# Patient Record
Sex: Male | Born: 1990 | Race: White | Hispanic: No | Marital: Single | Smoking: Current some day smoker
Health system: Southern US, Community
[De-identification: ages and names within clinical notes are randomized; demographics above are authoritative.]

---

## 2016-01-12 ENCOUNTER — Ambulatory Visit (INDEPENDENT_AMBULATORY_CARE_PROVIDER_SITE_OTHER): Payer: BLUE CROSS/BLUE SHIELD | Admitting: Osteopathic Medicine

## 2016-01-12 ENCOUNTER — Encounter: Payer: Self-pay | Admitting: Osteopathic Medicine

## 2016-01-12 VITALS — BP 112/74 | HR 64 | Ht 68.0 in | Wt 177.0 lb

## 2016-01-12 DIAGNOSIS — M25512 Pain in left shoulder: Secondary | ICD-10-CM | POA: Diagnosis not present

## 2016-01-12 DIAGNOSIS — M25521 Pain in right elbow: Secondary | ICD-10-CM | POA: Diagnosis not present

## 2016-01-12 DIAGNOSIS — J4530 Mild persistent asthma, uncomplicated: Secondary | ICD-10-CM

## 2016-01-12 DIAGNOSIS — T7589XA Other specified effects of external causes, initial encounter: Secondary | ICD-10-CM | POA: Diagnosis not present

## 2016-01-12 DIAGNOSIS — IMO0001 Reserved for inherently not codable concepts without codable children: Secondary | ICD-10-CM

## 2016-01-12 MED ORDER — ALBUTEROL SULFATE 108 (90 BASE) MCG/ACT IN AEPB: 1.0000 | INHALATION_SPRAY | Freq: Four times a day (QID) | RESPIRATORY_TRACT | Status: AC | PRN

## 2016-01-12 MED ORDER — MOMETASONE FUROATE 110 MCG/INH IN AEPB: 1.0000 | INHALATION_SPRAY | Freq: Every day | RESPIRATORY_TRACT | Status: AC

## 2016-01-12 NOTE — Progress Notes (Signed)
HPI: Patrick Villanueva is a 25 y.o. male who presents to [facility] today for chief complaint of:  Chief Complaint  Patient presents with  . Establish Care    CONGESTION AND POSSIBLE TENDONITIS    ILLNESS/CHEST CONGESTION . 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 No hx asthma. No pets. No known mold exp . Duration: started 6 months ago  . Timing: worse at work, better at home and at nighttime, some problems with air  . Modifying factors: OTC meds tried but not helpful  . Assoc signs/symptoms:   ELBOW PAIN . Location: R arm at medial elbow  . Quality: sharp with rotation motion . Timing: comes and goes  . Context: works as Personnel officer  . Modifying factors: Right handed  . Assoc signs/symptoms: no hand/finger numbness, no wrist pain   SHOULDER  . Location: L sholder in the front [facility] joint area . Quality: pinching pain . Modifying factors: worse with stretching up    Past medical, social and family history reviewed: History reviewed. No pertinent past medical history. History reviewed. No pertinent past surgical history. Social History  Substance Use Topics  . Smoking status: Current Some Day Smoker -- 1 years    Types: Cigars  . Smokeless tobacco: Not on file  . Alcohol Use: Not on file   Family History  Problem Relation Age of Onset  . Depression Mother   . Hypertension Mother   . Hypertension Maternal Grandmother   . Hyperlipidemia Maternal Grandmother   . Diabetes Paternal Grandmother   . Heart attack Maternal Grandfather     No current outpatient prescriptions on file.   No current facility-administered medications for this visit.   Allergies  Allergen Reactions  . Diphenhydramine Rash      Review of Systems: CONSTITUTIONAL:  No  fever, no chills, No recent illness, No unintentional weight changes HEAD/EYES/EARS/NOSE/THROAT: No  headache, no vision change, no hearing change, No sore throat, No  sinus  pressure CARDIAC: No  chest pain, No  pressure, No palpitations, No  orthopnea RESPIRATORY: (+) cough, No  shortness of breath/wheeze GASTROINTESTINAL: No  nausea, No  vomiting, No  abdominal pain, No  blood in stool, No  diarrhea, No  constipation  MUSCULOSKELETAL: (+) myalgia/arthralgia GENITOURINARY: No  incontinence, No  abnormal genital bleeding/discharge SKIN: No  rash/wounds/concerning lesions HEM/ONC: No  easy bruising/bleeding, No  abnormal lymph node ENDOCRINE: No polyuria/polydipsia/polyphagia, No  heat/cold intolerance  NEUROLOGIC: No  weakness, No  dizziness, No  slurred speech PSYCHIATRIC: No  concerns with depression, No  concerns with anxiety, No sleep problems  Exam:  BP 112/74 mmHg  Pulse 64  Ht  (1.727 m)  Wt 177 lb (80.287 kg)  BMI 26.92 kg/m2 Constitutional: VS see above. General Appearance: alert, well-developed, well-nourished, NAD Eyes: Normal lids and conjunctive, non-icteric sclera, PERRLA Ears, Nose, Mouth, Throat: MMM, Normal external inspection ears/nares/mouth/lips/gums, TM normal bilaterally. Pharynx/tonsils no erythema, no exudate. Nasal mucosa normal.   Neck: No masses, trachea midline. No thyroid enlargement. No tenderness/mass appreciated. No lymphadenopathy Respiratory: Normal respiratory effort. no wheeze, no rhonchi, no rales Cardiovascular: S1/S2 normal, no murmur, no rub/gallop auscultated. RRR. No lower extremity edema. Gastrointestinal: Nontender, no masses.  Musculoskeletal: Gait normal. No clubbing/cyanosis of digits.  left Shoulder Exam Range of motion: normal Strength tests:   Empty Can (Supraspinatus): negative  Drop Arm (Supraspinatus):negative  Liftoff (Subscapularis): negative  Yergason (Biceps Tendonitis/Labrum): positive  Instability tests  Apprehension (Anterior Instability): negative Impingement tests  Crossover (AC Joint): positive  Hawkins (Impingement/Subacromial Bursitis):   ExtRot (Subscap against  Coracoaromion): negative  IntRot (Supspin, TeresMin, Infraspin): negative  Neer's (Posterior impingement, Biceps tendonitis): negative Right elbow exam: (+) tenderness at medial epicondyle with supination Skin: warm, dry, intact. No rash/ulcer. No concerning nevi or subq nodules on limited exam.   Psychiatric: Normal judgment/insight. Normal mood and affect. Oriented x3.     ASSESSMENT/PLAN: See instructions - treat as asthma variant  / RAD d/t airway irritant exposure. Consider job change, review chemicals he might be exposed to [employer](occ health?). Consider PFT or pulm referral for methacholine challenge if no better.   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 Visit summary with medication list and pertinent instructions was printed for patient to review. ER/RTC precautions were reviewed with the patient. Return if symptoms worsen or fail to improve.

## 2016-01-12 NOTE — Patient Instructions (Signed)
First try Proair Respiclick device (albuterol) as needed - whether prior to going into spaces which normally irritate the lungs, and as needed for chest congestion. If you find you are using this every day or multiple times per day, fill the prescription for the Arnuity Ellipta device (inhaled steroid) and use this daily prior to going in to work. If no better, let us know!   If these medications are too expensive, please call the office and we can easily prescribe a cheaper alternative, but usually with cheaper traditional inhalers a spacer device needs to be used. (Will discuss this more if needed).

## 2016-01-14 DIAGNOSIS — Z779 Other contact with and (suspected) exposures hazardous to health: Secondary | ICD-10-CM | POA: Insufficient documentation

## 2016-01-14 DIAGNOSIS — T7589XA Other specified effects of external causes, initial encounter: Secondary | ICD-10-CM

## 2016-01-14 DIAGNOSIS — J45909 Unspecified asthma, uncomplicated: Secondary | ICD-10-CM | POA: Insufficient documentation

## 2016-01-14 DIAGNOSIS — M25521 Pain in right elbow: Secondary | ICD-10-CM | POA: Insufficient documentation

## 2016-01-14 DIAGNOSIS — M25512 Pain in left shoulder: Secondary | ICD-10-CM | POA: Insufficient documentation

## 2016-02-08 ENCOUNTER — Ambulatory Visit (INDEPENDENT_AMBULATORY_CARE_PROVIDER_SITE_OTHER): Payer: BLUE CROSS/BLUE SHIELD

## 2016-02-08 ENCOUNTER — Ambulatory Visit (INDEPENDENT_AMBULATORY_CARE_PROVIDER_SITE_OTHER): Payer: BLUE CROSS/BLUE SHIELD | Admitting: Family Medicine

## 2016-02-08 ENCOUNTER — Encounter: Payer: Self-pay | Admitting: Family Medicine

## 2016-02-08 VITALS — BP 128/81 | HR 73 | Temp 98.0°F | Wt 182.0 lb

## 2016-02-08 DIAGNOSIS — M25512 Pain in left shoulder: Secondary | ICD-10-CM

## 2016-02-08 DIAGNOSIS — L237 Allergic contact dermatitis due to plants, except food: Secondary | ICD-10-CM

## 2016-02-08 MED ORDER — DICLOFENAC SODIUM 1 % TD GEL: 2.0000 g | Freq: Four times a day (QID) | TRANSDERMAL | Status: AC

## 2016-02-08 MED ORDER — TRIAMCINOLONE ACETONIDE 0.5 % EX CREA: 1.0000 "application " | TOPICAL_CREAM | Freq: Two times a day (BID) | CUTANEOUS | Status: AC

## 2016-02-08 MED ORDER — PREDNISONE 5 MG (48) PO TBPK: ORAL_TABLET | ORAL | Status: DC

## 2016-02-08 NOTE — Patient Instructions (Signed)
Thank you for coming in today. Get xray today.  Take prednisone and use triamcinolone cream for the bad rash.  Use Voltaren gel on the shoulder pain Recheck in a few months  Poison [employer] Poison ivy is a inflammation of the skin (contact dermatitis) caused by touching the allergens on the leaves of the ivy plant following previous exposure to the plant. The rash usually appears 48 hours after exposure. The rash is usually bumps (papules) or blisters (vesicles) in a linear pattern. Depending on your own sensitivity, the rash may simply cause redness and itching, or it may also progress to blisters which may break open. These must be well cared for to prevent secondary bacterial (germ) infection, followed by scarring. Keep any open areas dry, clean, dressed, and covered with an antibacterial ointment if needed. The eyes may also get puffy. The puffiness is worst in the morning and gets better as the day progresses. This dermatitis usually heals without scarring, within 2 to 3 weeks without treatment. HOME CARE INSTRUCTIONS  Thoroughly wash with soap and water as soon as you have been exposed to poison ivy. You have about one half hour to remove the plant resin before it will cause the rash. This washing will destroy the oil or antigen on the skin that is causing, or will cause, the rash. Be sure to wash under your fingernails as any plant resin there will continue to spread the rash. Do not rub skin vigorously when washing affected area. Poison ivy cannot spread if no oil from the plant remains on your body. A rash that has progressed to weeping sores will not spread the rash unless you have not washed thoroughly. It is also important to wash any clothes you have been wearing as these may carry active allergens. The rash will return if you wear the unwashed clothing, even several days later. Avoidance of the plant in the future is the best measure. Poison ivy plant can be recognized by the number of leaves.  Generally, poison ivy has three leaves with flowering branches on a single stem. Diphenhydramine may be purchased over the counter and used as needed for itching. Do not drive with this medication if it makes you drowsy.Ask your caregiver about medication for children. SEEK MEDICAL CARE IF:  Open sores develop.  Redness spreads beyond area of rash.  You notice purulent (pus-like) discharge.  You have increased pain.  Other signs of infection develop (such as fever).   This information is not intended to replace advice given to you by your health care provider. Make sure you discuss any questions you have with your health care provider.   Document Released: 07/07/2000 Document Revised: 10/02/2011 Document Reviewed: 12/16/2014 Elsevier Interactive Patient Education [employer]2016 Elsevier Inc.

## 2016-02-08 NOTE — Progress Notes (Signed)
   Subjective:    I'm seeing this patient as a consultation for:  Dr Lyn HollingsheadAlexander  CC: Left shoulder pain and rash  HPI: 1) left shoulder pain: Patient notes left superior shoulder pain worse with overhand motion and reaching across. Pain is sometimes worse with overhead lifting at work. He denies any radiating pain weakness or numbness fevers or chills. He has not tried any medications yet and does some light home exercises for shoulder pain which seems to help some.    2)  poison sumac exposure: Patient notes a several day history of rash on face trunk and extremities due to poison sumac exposure. He notes the rash is itchy. He has tried no treatment. He denies any tongue or lip swelling or trouble breathing. He feels well otherwise.    Past medical history, Surgical history, Family history not pertinant except as noted below, Social history, Allergies, and medications have been entered into the medical record, reviewed, and no changes needed.   Review of Systems: No headache, visual changes, nausea, vomiting, diarrhea, constipation, dizziness, abdominal pain, skin rash, fevers, chills, night sweats, weight loss, swollen lymph nodes, body aches, joint swelling, muscle aches, chest pain, shortness of breath, mood changes, visual or auditory hallucinations.   Objective:    Filed Vitals:   02/08/16 1248  BP: 128/81  Pulse: 73  Temp: 98 F (36.7 C)   Gen: Well NAD HEENT: EOMI,  MMM Lungs: Normal work of breathing. CTABL Heart: RRR no MRG Abd: NABS, Soft. Nondistended, Nontender Exts: Brisk capillary refill, warm and well perfused.  Left shoulder: Tender to palpation overlying the acromioclavicular joint. Normal flow range of motion without pain Normal strength to abduction, forward flexion, external and internal rotation Negative Hawkins test mildly positive Neer's test.Negative empty can test. Positive crossover arm  compression test. Positive O'Brien's test. Negative clunk in clinic test. Negative Yergason's and speeds test.  Skin: Extensive widespread erythematous maculopapular rash with some vesicles on face trunk and all 4 extremities  X-ray left shoulder pending  No results found for this or any previous visit (from the past 24 hour(s)). No results found.  Impression and Recommendations:    Assessment and Plan: 25 y.o. male with  Left shoulder pain: Very likely due to distal clavicle ostial lysis. X-ray pending. Discussed options. Plan to treat conservatively with diclofenac gel. If not better steroid injection will very likely be helpful. Recheck in a few months. 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 Treat with oral prednisone, and topical triamcinolone  Ointment.    Discussed warning signs or symptoms. Please see discharge instructions. Patient expresses understanding..   CC: Sunnie NielsenNatalie Alexander, DO

## 2016-02-09 NOTE — Progress Notes (Signed)
Quick Note:  Xray looks ok ______

## 2016-02-15 ENCOUNTER — Telehealth: Payer: Self-pay | Admitting: *Deleted

## 2016-02-15 NOTE — Telephone Encounter (Signed)
PA submitted through covermymeds  Key: [id number]

## 2016-02-21 NOTE — Telephone Encounter (Signed)
Med has been deniedDeniedon July 26

## 2016-02-22 NOTE — Telephone Encounter (Signed)
What was denied?

## 2016-02-22 NOTE — Telephone Encounter (Signed)
Diclofenac gel

## 2016-02-23 NOTE — Telephone Encounter (Signed)
It is $26 cash price with a good rx coupon at [facility]. I have printed the coupon and he can pick it up or he can make his own coupon by going to [email or web address] and searching for the medicine.  He should get it for the cash price or not at all.

## 2016-02-23 NOTE — Telephone Encounter (Signed)
Pt notified and coupon is up front

## 2017-08-26 ENCOUNTER — Other Ambulatory Visit: Payer: Self-pay

## 2017-08-26 ENCOUNTER — Emergency Department (HOSPITAL_COMMUNITY): Payer: BLUE CROSS/BLUE SHIELD

## 2017-08-26 ENCOUNTER — Emergency Department (HOSPITAL_COMMUNITY)
Admission: EM | Admit: 2017-08-26 | Discharge: 2017-08-26 | Disposition: A | Discharge status: Home / Self Care | Payer: BLUE CROSS/BLUE SHIELD | Attending: Emergency Medicine | Admitting: Emergency Medicine

## 2017-08-26 ENCOUNTER — Encounter (HOSPITAL_COMMUNITY): Payer: Self-pay | Admitting: Emergency Medicine

## 2017-08-26 DIAGNOSIS — F17290 Nicotine dependence, other tobacco product, uncomplicated: Secondary | ICD-10-CM | POA: Insufficient documentation

## 2017-08-26 DIAGNOSIS — Y999 Unspecified external cause status: Secondary | ICD-10-CM | POA: Insufficient documentation

## 2017-08-26 DIAGNOSIS — Z23 Encounter for immunization: Secondary | ICD-10-CM | POA: Insufficient documentation

## 2017-08-26 DIAGNOSIS — W1839XA Other fall on same level, initial encounter: Secondary | ICD-10-CM | POA: Insufficient documentation

## 2017-08-26 DIAGNOSIS — Y9301 Activity, walking, marching and hiking: Secondary | ICD-10-CM | POA: Insufficient documentation

## 2017-08-26 DIAGNOSIS — Y929 Unspecified place or not applicable: Secondary | ICD-10-CM | POA: Insufficient documentation

## 2017-08-26 DIAGNOSIS — W101XXA Fall (on)(from) sidewalk curb, initial encounter: Secondary | ICD-10-CM | POA: Insufficient documentation

## 2017-08-26 DIAGNOSIS — S0083XA Contusion of other part of head, initial encounter: Secondary | ICD-10-CM

## 2017-08-26 DIAGNOSIS — W19XXXA Unspecified fall, initial encounter: Secondary | ICD-10-CM

## 2017-08-26 DIAGNOSIS — S0181XA Laceration without foreign body of other part of head, initial encounter: Secondary | ICD-10-CM | POA: Insufficient documentation

## 2017-08-26 LAB — ETHANOL: Alcohol, Ethyl (B): 220 mg/dL — ABNORMAL HIGH (ref <10)

## 2017-08-26 MED ORDER — KETOROLAC TROMETHAMINE 30 MG/ML IJ SOLN
30.0000 mg | Freq: Once | INTRAMUSCULAR | Status: AC
  Administered 2017-08-26: 30 mg via INTRAVENOUS
  Filled 2017-08-26: qty 1

## 2017-08-26 MED ORDER — SODIUM CHLORIDE 0.9 % IV BOLUS (SEPSIS)
1000.0000 mL | Freq: Once | INTRAVENOUS | Status: AC
  Administered 2017-08-26: 1000 mL via INTRAVENOUS

## 2017-08-26 MED ORDER — CLINDAMYCIN PHOSPHATE 600 MG/50ML IV SOLN
600.0000 mg | Freq: Once | INTRAVENOUS | Status: AC
  Administered 2017-08-26: 600 mg via INTRAVENOUS
  Filled 2017-08-26: qty 50

## 2017-08-26 MED ORDER — LIDOCAINE-EPINEPHRINE (PF) 2 %-1:200000 IJ SOLN
20.0000 mL | Freq: Once | INTRAMUSCULAR | Status: AC
  Administered 2017-08-26: 20 mL
  Filled 2017-08-26: qty 20

## 2017-08-26 MED ORDER — CEPHALEXIN 500 MG PO CAPS
ORAL_CAPSULE | ORAL | 0 refills | Status: AC
Start: 2017-08-26 — End: ?

## 2017-08-26 MED ORDER — MELOXICAM 15 MG PO TABS: 15.0000 mg | ORAL_TABLET | Freq: Every day | ORAL | 0 refills | Status: DC | PRN

## 2017-08-26 MED ORDER — MELOXICAM 15 MG PO TABS: 15.0000 mg | ORAL_TABLET | Freq: Every day | ORAL | 0 refills | Status: AC | PRN

## 2017-08-26 MED ORDER — TETANUS-DIPHTH-ACELL PERTUSSIS 5-2.5-18.5 LF-MCG/0.5 IM SUSP
0.5000 mL | Freq: Once | INTRAMUSCULAR | Status: AC
  Administered 2017-08-26: 0.5 mL via INTRAMUSCULAR
  Filled 2017-08-26: qty 0.5

## 2017-08-26 MED ORDER — ONDANSETRON HCL 4 MG/2ML IJ SOLN
4.0000 mg | Freq: Once | INTRAMUSCULAR | Status: AC
  Administered 2017-08-26: 4 mg via INTRAVENOUS
  Filled 2017-08-26: qty 2

## 2017-08-26 MED ORDER — CEPHALEXIN 500 MG PO CAPS: ORAL_CAPSULE | ORAL | 0 refills | Status: DC

## 2017-08-26 MED ORDER — PANTOPRAZOLE SODIUM 40 MG IV SOLR
40.0000 mg | Freq: Once | INTRAVENOUS | Status: AC
  Administered 2017-08-26: 40 mg via INTRAVENOUS
  Filled 2017-08-26: qty 40

## 2017-08-26 NOTE — ED Triage Notes (Signed)
Pt brought in by EMS from home with c/o facial injury after his mechanical fall tonight.  Pt has been drinking alcohol tonight.  Pt fell forward on a concrete pavement  while walking back home---- pt sustained laceration to lower left mandible, bleeding controlled.  Was placed on c-collar by EMS on scene.

## 2017-08-26 NOTE — ED Provider Notes (Signed)
[facility]-EMERGENCY DEPT Provider Note   CSN: [id number] Arrival date & time: 08/26/17  0326     History   Chief Complaint Chief Complaint  Patient presents with  . Fall    HPI Patrick Villanueva is a 27 y.o. male.  Who presents the emergency department for chief complaint of facial injury.  The patient was out with his buddies and was drinking alcohol.  His friends were walking ahead and the next thing they know they turned around and heard him hit the ground.  The patient face planted on the sidewalk and had laceration to the left side of his lower facial region.  He complains of pain around his right zygoma and right eye orbit.  He is in a c-collar.  He denies any upper extremity weakness or numbness.  His friends say they are not sure if he knocked himself out because they had some difficulty waking him up which may have been because of his alcohol intoxication.  HPI  History reviewed. No pertinent past medical history.  Patient Active Problem List   Diagnosis Date Noted  . Reactive airway disease 01/14/2016  . Exposure to respiratory irritant 01/14/2016  . Pain in joint of left shoulder 01/14/2016  . Pain in joint of right elbow 01/14/2016    History reviewed. No pertinent surgical history.     Home Medications    Prior to Admission medications   Medication Sig Start Date End Date Taking? Authorizing Provider  Albuterol Sulfate (PROAIR RESPICLICK) 108 (90 Base) MCG/ACT AEPB Inhale 1-2 puffs into the lungs every 6 (six) hours as needed (exposure to irritant, shortness of breath / chest congestion). 01/12/16  Yes Sunnie NielsenAlexander, Natalie, DO  diclofenac sodium (VOLTAREN) 1 % GEL Apply 2 g topically 4 (four) times daily. To affected joint. Patient not taking: Reported on 08/26/2017 02/08/16   Rodolph Bongorey, Evan S, MD  Mometasone Furoate (ASMANEX 30 METERED DOSES) 110 MCG/INH AEPB Inhale 1 puff into the lungs daily. Patient not taking: Reported on 08/26/2017 01/12/16    Sunnie NielsenAlexander, Natalie, DO  triamcinolone cream (KENALOG) 0.5 % Apply 1 application topically 2 (two) times daily. To affected areas. Patient not taking: Reported on 08/26/2017 02/08/16   Rodolph Bongorey, Evan S, MD    Family History Family History  Problem Relation Age of Onset  . Depression Mother   . Hypertension Mother   . Hypertension Maternal Grandmother   . Hyperlipidemia Maternal Grandmother   . Diabetes Paternal Grandmother   . Heart attack Maternal Grandfather     Social History Social History   Tobacco Use  . Smoking status: Current Some Day Smoker    Years: 1.00    Types: Cigars  . Smokeless tobacco: Never Used  Substance Use Topics  . Alcohol use: Yes    Alcohol/week: 0.0 oz  . Drug use: No     Allergies   Diphenhydramine   Review of Systems Review of Systems  Ten systems reviewed and are negative for acute change, except as noted in the HPI.   Physical Exam Updated Vital Signs BP 122/78   Pulse 96   Temp 98.4 F (36.9 C) (Oral)   Resp 20   SpO2 99%   Physical Exam  Constitutional: He appears well-developed and well-nourished. No distress.  HENT:  Head: Normocephalic.  3 cm laceration of the left mental region. Through and through to the oral mucosa  Eyes: Conjunctivae and EOM are normal. Pupils are equal, round, and reactive to light. No scleral icterus.  Neck:  Normal range of motion. Neck supple.  Cardiovascular: Normal rate, regular rhythm and normal heart sounds.  Pulmonary/Chest: Effort normal and breath sounds normal. No respiratory distress.  Abdominal: Soft. There is no tenderness.  Musculoskeletal: He exhibits no edema.  Neurological: He is alert.  Skin: Skin is warm and dry. He is not diaphoretic.  Psychiatric: His behavior is normal.  Nursing note and vitals reviewed.    ED Treatments / Results  Labs (all labs ordered are listed, but only abnormal results are displayed) Labs Reviewed  ETHANOL - Abnormal; Notable for the following  components:      Result Value   Alcohol, Ethyl (B) 220 (*)    All other components within normal limits    EKG  EKG Interpretation None       Radiology No results found.  Procedures Procedures (including critical care time)   LACERATION REPAIR Performed by: Arthor Captain Authorized by: Arthor Captain Consent: Verbal consent obtained. Risks and benefits: risks, benefits and alternatives were discussed Consent given by: patient Patient identity confirmed: provided demographic data Prepped and Draped in normal sterile fashion Wound explored  Laceration Location: face  Laceration Length: 3cm  No Foreign Bodies seen or palpated  Anesthesia: local infiltration  Local anesthetic: lidocaine 2% w epinephrine  Anesthetic total: 4 ml  Irrigation method: syringe Amount of cleaning: standard  Skin closure: vicryl   Number of sutures: 4  Technique: SI  Patient tolerance: Patient tolerated the procedure well with no immediate complications.  Medications Ordered in ED Medications  sodium chloride 0.9 % bolus 1,000 mL (1,000 mLs Intravenous New Bag/Given 08/26/17 0412)  lidocaine-EPINEPHrine (XYLOCAINE W/EPI) 2 %-1:200000 (PF) injection 20 mL (not administered)  ondansetron (ZOFRAN) injection 4 mg (4 mg Intravenous Given 08/26/17 0412)  pantoprazole (PROTONIX) injection 40 mg (40 mg Intravenous Given 08/26/17 0412)  Tdap (BOOSTRIX) injection 0.5 mL (0.5 mLs Intramuscular Given 08/26/17 0415)  clindamycin (CLEOCIN) IVPB 600 mg (0 mg Intravenous Stopped 08/26/17 0500)     Initial Impression / Assessment and Plan / ED Course  I have reviewed the triage vital signs and the nursing notes.  Pertinent labs & imaging results that were available during my care of the patient were reviewed by me and considered in my medical decision making (see chart for details).     Patient imaging negative for any acute abnormalities.  I have repaired his wound.  He is clinically sober and  ambulating appropriately in the emergency department.  Seen and shared visit with Dr. Read Drivers.  Patient appears appropriate for discharge at this time.  Final Clinical Impressions(s) / ED Diagnoses   Final diagnoses:  Fall, initial encounter  Contusion of face, initial encounter  Facial laceration, initial encounter    ED Discharge Orders    None       Arthor Captain, PA-C 08/26/17 1610    Paula Libra, MD 08/26/17 [phone]

## 2017-08-26 NOTE — Discharge Instructions (Signed)
Please take your antibiotics until finished. Return for any sign of infection, (worsening pain, heat, redness swelling, purulent drainage [pus]).

## 2017-08-26 NOTE — ED Notes (Signed)
Bed: [id number] Expected date:  Expected time:  Means of arrival:  Comments: 6231m etoh/laceration

## 2017-08-28 ENCOUNTER — Encounter: Payer: Self-pay | Admitting: Osteopathic Medicine

## 2019-04-16 IMAGING — CT CT CERVICAL SPINE W/O CM
5 of 7 series · 13 of 33 positions shown, 14 images · non-contrast
Comparison: None.

CLINICAL DATA: Mechanical fall after drinking alcohol tonight.

EXAM:
CT MAXILLOFACIAL WITHOUT CONTRAST
CT CERVICAL SPINE WITHOUT CONTRAST
TECHNIQUE: Multidetector CT imaging of the maxillofacial structures was
performed. Multiplanar CT image reconstructions were also generated.
A small metallic BB was placed on the right temple in order to
reliably differentiate right from left.
Multidetector CT imaging of the cervical spine was performed without
intravenous contrast. Multiplanar CT image reconstructions were also
generated.

[Series 4: c spine soft · axial · 0.27mm/px · z∈[-282,-192]mm · 3 of 91 slices shown]
[im 23/91  soft-tissue]
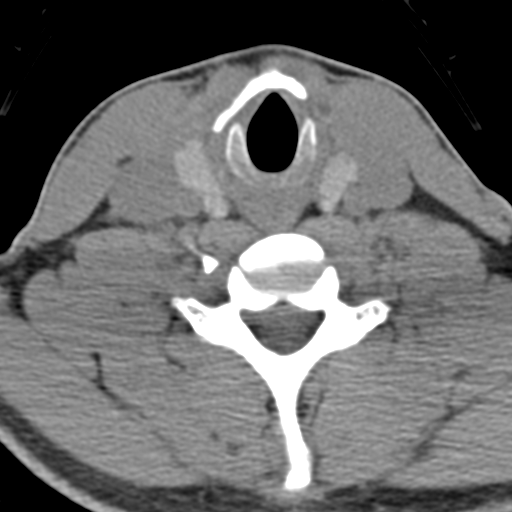
[im 46/91  soft-tissue]
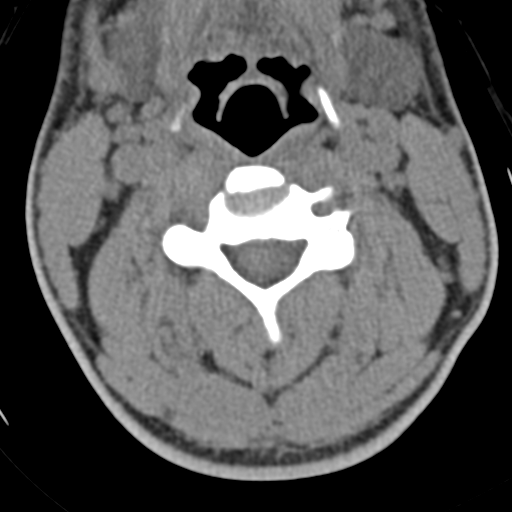
[im 68/91  soft-tissue]
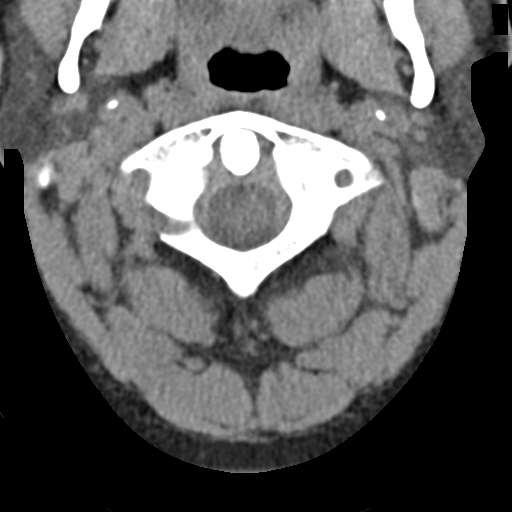

[Series 6: max soft · axial · 0.33mm/px · z∈[-200,-148]mm · 2 of 79 slices shown]
[im 27/79  soft-tissue]
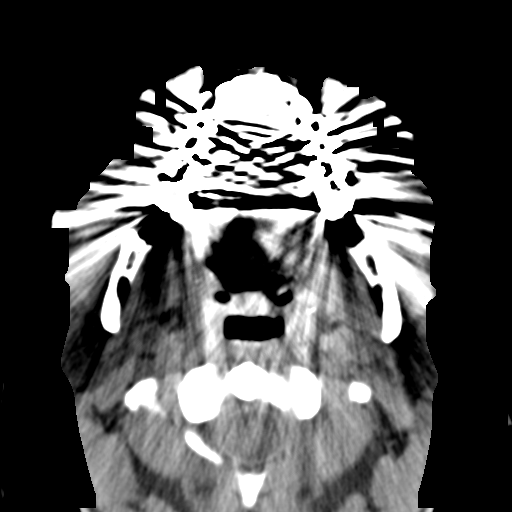
[im 53/79  soft-tissue]
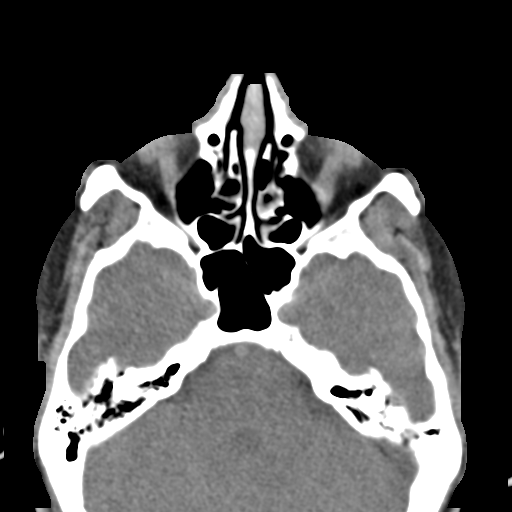

[Series 10: orthogonal axials · axial · 0.23mm/px · z∈[-325,-253]mm · 3 of 91 slices shown, 4 images]
[im 23/91  soft-tissue]
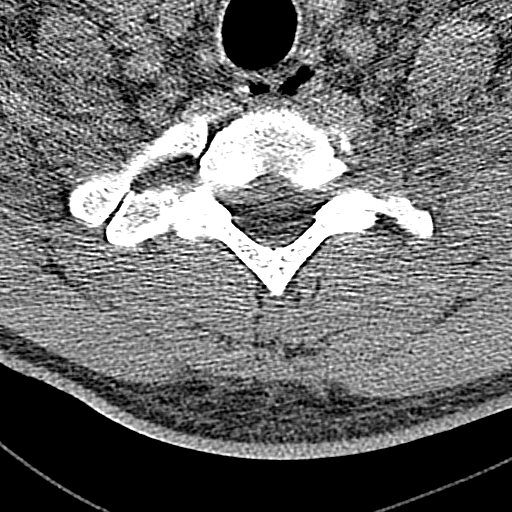
[im 23/91  bone]
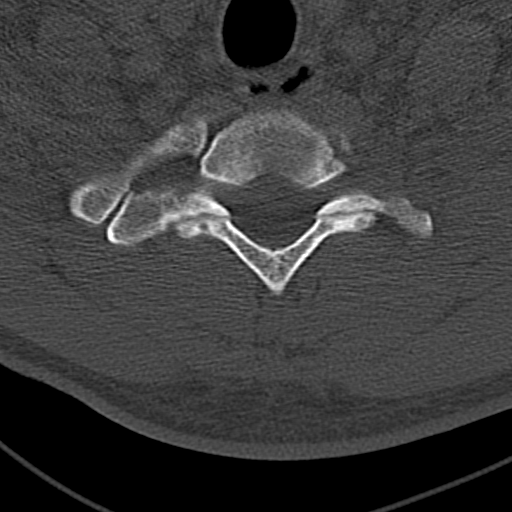
[im 46/91  bone]
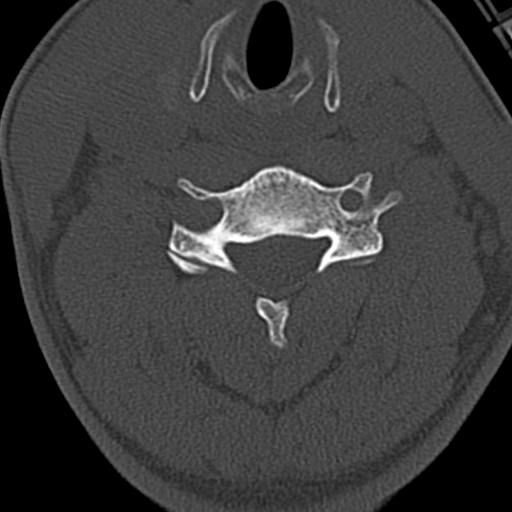
[im 68/91  bone]
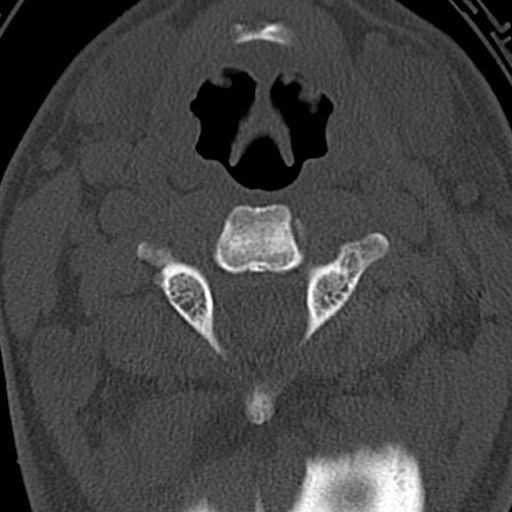

[Series 12: sagittal bone · sagittal · 0.27mm/px · 2 of 32 slices shown]
[im 11/32  bone]
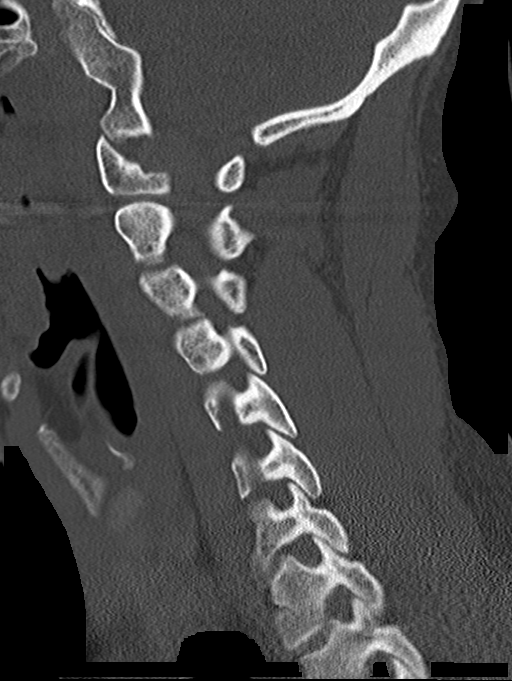
[im 21/32  bone]
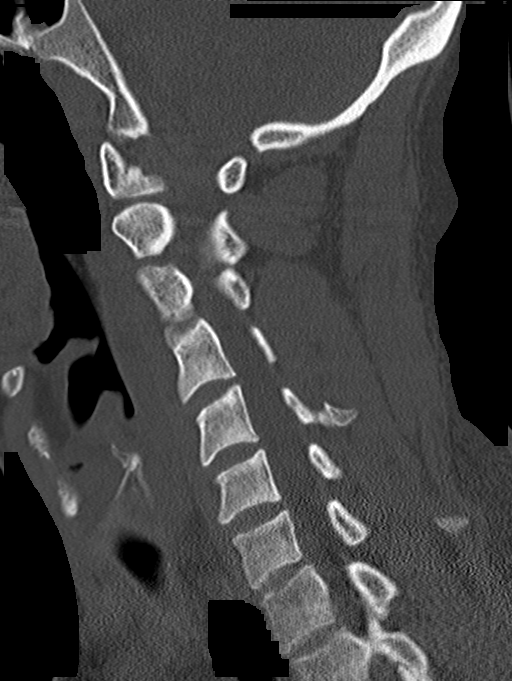

[Series 13: coronal soft · coronal · 0.31mm/px · 3 of 77 slices shown]
[im 20/77  bone]
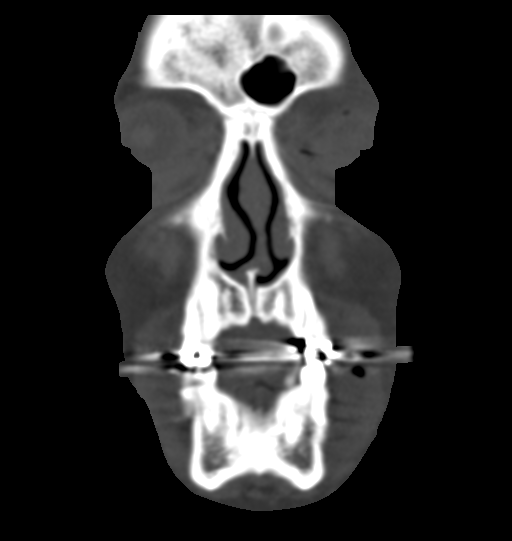
[im 39/77  bone]
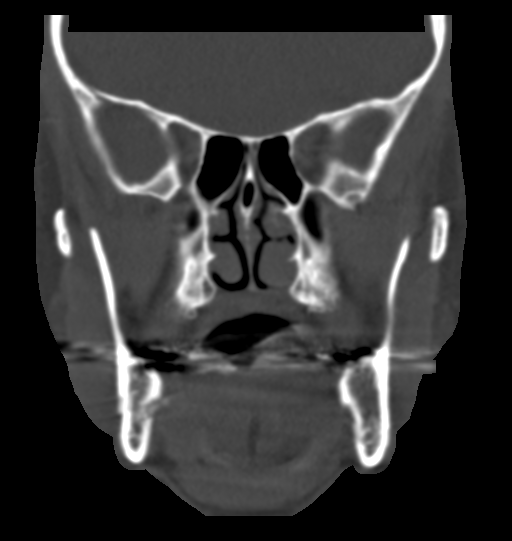
[im 58/77  bone]
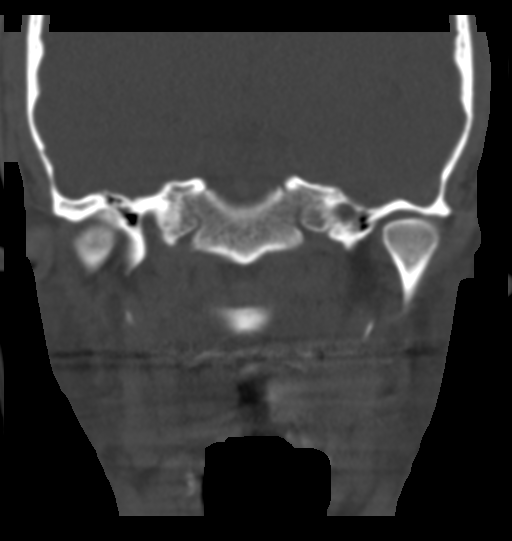

[13 of 33 positions shown; findings below may reference images not displayed]

FINDINGS: CT MAXILLOFACIAL moderate motion through lower face.

OSSEOUS: The mandible is intact, the condyles are located. No acute
facial fracture. No destructive bony lesions.

ORBITS: Ocular globes and orbital contents are normal.

SINUSES: Mild paranasal sinus mucosal thickening. Bilateral concha
lamella. Nasal septum is midline. Included mastoid aircells are well
aerated.

SOFT TISSUES: LEFT lower face soft tissue swelling, subcutaneous fat
stranding compatible with contusion, no subcutaneous gas or
radiopaque foreign bodies.

LIMITED INTRACRANIAL: Normal.

CT CERVICAL FINDINGS

ALIGNMENT: Straightened lordosis. Vertebral bodies in alignment.
Mild broad levoscoliosis is likely positional.

SKULL BASE AND VERTEBRAE: Cervical vertebral bodies and posterior
elements are intact. Intervertebral disc heights preserved. No
destructive bony lesions. C1-2 articulation maintained.

SOFT TISSUES AND SPINAL CANAL: Normal.

DISC LEVELS: No significant osseous canal stenosis or neural
foraminal narrowing.

UPPER CHEST: Lung apices are clear.

OTHER: None.
IMPRESSION: CT MAXILLOFACIAL:

1. No acute facial fracture though, motion through the level of the
mandible limits assessment.
2. LEFT lower facial soft tissue swelling/contusion.

CT CERVICAL SPINE:

1. No acute fracture or malalignment.
